# Patient Record
Sex: Male | Born: 1960 | Race: Black or African American | Hispanic: No | Marital: Single | Smoking: Former smoker
Health system: Southern US, Community
[De-identification: ages and names within clinical notes are randomized; demographics above are authoritative.]

## PROBLEM LIST (undated history)

## (undated) DIAGNOSIS — E119 Type 2 diabetes mellitus without complications: Secondary | ICD-10-CM

## (undated) DIAGNOSIS — F319 Bipolar disorder, unspecified: Secondary | ICD-10-CM

## (undated) DIAGNOSIS — J45909 Unspecified asthma, uncomplicated: Secondary | ICD-10-CM

---

## 2021-07-14 ENCOUNTER — Emergency Department (HOSPITAL_COMMUNITY): Payer: 59

## 2021-07-14 ENCOUNTER — Encounter (HOSPITAL_COMMUNITY): Payer: Self-pay | Admitting: Emergency Medicine

## 2021-07-14 ENCOUNTER — Emergency Department (HOSPITAL_COMMUNITY)
Admission: EM | Admit: 2021-07-14 | Discharge: 2021-07-15 | Disposition: A | Discharge status: Home / Self Care | Payer: 59 | Attending: Emergency Medicine | Admitting: Emergency Medicine

## 2021-07-14 ENCOUNTER — Other Ambulatory Visit: Payer: Self-pay

## 2021-07-14 DIAGNOSIS — J45909 Unspecified asthma, uncomplicated: Secondary | ICD-10-CM | POA: Insufficient documentation

## 2021-07-14 DIAGNOSIS — E119 Type 2 diabetes mellitus without complications: Secondary | ICD-10-CM | POA: Diagnosis not present

## 2021-07-14 DIAGNOSIS — W01198A Fall on same level from slipping, tripping and stumbling with subsequent striking against other object, initial encounter: Secondary | ICD-10-CM | POA: Diagnosis not present

## 2021-07-14 DIAGNOSIS — Z7901 Long term (current) use of anticoagulants: Secondary | ICD-10-CM | POA: Insufficient documentation

## 2021-07-14 DIAGNOSIS — Z96652 Presence of left artificial knee joint: Secondary | ICD-10-CM | POA: Insufficient documentation

## 2021-07-14 DIAGNOSIS — R9082 White matter disease, unspecified: Secondary | ICD-10-CM | POA: Diagnosis not present

## 2021-07-14 DIAGNOSIS — S40011A Contusion of right shoulder, initial encounter: Secondary | ICD-10-CM | POA: Insufficient documentation

## 2021-07-14 DIAGNOSIS — S4991XA Unspecified injury of right shoulder and upper arm, initial encounter: Secondary | ICD-10-CM | POA: Diagnosis present

## 2021-07-14 DIAGNOSIS — Z87891 Personal history of nicotine dependence: Secondary | ICD-10-CM | POA: Diagnosis not present

## 2021-07-14 DIAGNOSIS — Z79899 Other long term (current) drug therapy: Secondary | ICD-10-CM | POA: Insufficient documentation

## 2021-07-14 DIAGNOSIS — W19XXXA Unspecified fall, initial encounter: Secondary | ICD-10-CM

## 2021-07-14 HISTORY — DX: Type 2 diabetes mellitus without complications: E11.9

## 2021-07-14 HISTORY — DX: Bipolar disorder, unspecified: F31.9

## 2021-07-14 HISTORY — DX: Unspecified asthma, uncomplicated: J45.909

## 2021-07-14 NOTE — ED Notes (Signed)
PTAR called  

## 2021-07-14 NOTE — ED Triage Notes (Signed)
Pt BIB [facility] for a mechanical fall on Plavix.  Pt was entering house, a slpper came off and his sock got wet and he slipped and fell entering home. States he hit his head on the door, his left shoulder on a counter, and his bottom hurts from hitting the floor.   EMS reports VS WNL, CBG 346.  PT uses walker and cane at home.  Dan Humphreys is here with pt.

## 2021-07-14 NOTE — Progress Notes (Signed)
°   07/14/21 2215  TOC ED Mini Assessment  TOC Time spent with patient (minutes): 30  PING Used in TOC Assessment Yes  Admission or Readmission Diverted Yes  Interventions which prevented an admission or readmission [facility] or Services  What brought you to the Emergency Department?  Fall on Plavix  Barriers to Discharge No Barriers Identified  Barrier interventions CSW met with Pt at bedside. Pt reports that he has been living with daughter in [place] until he and daughter had a disagreement. Pt returned home yesterday.  Today he drove his car to social services to apply for disability and upon his return home his shoe fell off and he lost his footing and fell.  Pt initially requested to be sent to a long-term nursing facility. CSW explained that would be an out-of-pocket expense. CSW also explained that Pt has recently discharged form a rehab facility and would need 60 well days before returning to SNF.   Pt states that he is able to get around at home with walker and cane. Pt further reports that he is able to cook for himself and he has food to cook. Pt reports that he will speak with daughter in [place] to discuss possibility of returning to her home. Pt also states that he has another daughter in [place] that will be able to offer assistance after the 07/26/21.  Means of departure Ambulance

## 2021-07-14 NOTE — ED Provider Notes (Signed)
[facility] EMERGENCY DEPARTMENT Provider Note   CSN: [id number] Arrival date & time: 07/14/21  1853     History Chief Complaint  Patient presents with   Fall    Thinners     Norbert Malkin is a 60 y.o. male.  Pt reports his hoe slipped while going into his house and he fell.  Pt complains of pain in his bottom and both shoulders.  Pt reports hitting his head on the door.  Pt reports just a small bumping.  No loc.  Pt reports pain with moving right shoulder   The history is provided by the patient. No language interpreter was used.  Fall This is a new problem. The current episode started less than 1 hour ago. The problem occurs constantly. The problem has not changed since onset.Nothing aggravates the symptoms. Nothing relieves the symptoms. He has tried nothing for the symptoms.      Past Medical History:  Diagnosis Date   Asthma    Bipolar 1 disorder (HCC)    Diabetes mellitus without complication (HCC)     There are no problems to display for this patient.   Past Surgical History:  Procedure Laterality Date   KNEE ARTHROSCOPY     ROTATOR CUFF REPAIR Left        Family History  Problem Relation Age of Onset   Cancer Mother    ALS Father     Social History   Tobacco Use   Smoking status: Former    Types: Cigarettes   Smokeless tobacco: Never  Vaping Use   Vaping Use: Never used  Substance Use Topics   Alcohol use: Not Currently   Drug use: Not Currently    Home Medications Prior to Admission medications   Medication Sig Start Date End Date Taking? Authorizing Provider  amitriptyline (ELAVIL) 50 MG tablet Take 50 mg by mouth at bedtime. 05/09/21   [provider]  atorvastatin (LIPITOR) 80 MG tablet Take 80 mg by mouth at bedtime. 07/02/21   [provider]  clopidogrel (PLAVIX) 75 MG tablet Take 75 mg by mouth daily. 07/02/21   [provider]  cyclobenzaprine (FLEXERIL) 10 MG tablet Take 10 mg by mouth 2  (two) times daily as needed. 02/17/21   [provider]  diclofenac (VOLTAREN) 50 MG EC tablet Take 50 mg by mouth 2 (two) times daily. 05/20/21   [provider]  lisinopril (ZESTRIL) 20 MG tablet Take 20 mg by mouth daily. 07/02/21   [provider]  meloxicam (MOBIC) 15 MG tablet Take 15 mg by mouth daily as needed. 04/29/21   [provider]  NOVOLIN 70/30 KWIKPEN (70-30) 100 UNIT/ML KwikPen SMARTSIG:20 Unit(s) SUB-Q Every Morning 07/02/21   [provider]  ondansetron (ZOFRAN-ODT) 4 MG disintegrating tablet Take 4 mg by mouth every 8 (eight) hours as needed. 04/14/21   [provider]  orphenadrine (NORFLEX) 100 MG tablet Take 100 mg by mouth 2 (two) times daily as needed. 04/07/21   [provider]  scopolamine (TRANSDERM-SCOP) 1 MG/3DAYS SMARTSIG:Topical 07/02/21   [provider]  sildenafil (VIAGRA) 100 MG tablet Take 100 mg by mouth as needed. 05/06/21   [provider]  tamsulosin (FLOMAX) 0.4 MG CAPS capsule Take 0.4 mg by mouth daily. 07/02/21   [provider]  traMADol (ULTRAM) 50 MG tablet Take 50-100 mg by mouth every 6 (six) hours as needed. 02/17/21   [provider]    Allergies    Percocet [oxycodone-acetaminophen]  and Sulfa antibiotics  Review of Systems   Review of Systems  All other systems reviewed and are negative.  Physical Exam Updated Vital Signs BP 128/88 (BP Location: Right Arm)    Pulse (!) 102    Temp 98.9 F (37.2 C) (Oral)    Resp 20    SpO2 98%   Physical Exam Vitals and nursing note reviewed.  Constitutional:      Appearance: He is well-developed.  HENT:     Head: Normocephalic and atraumatic.     Right Ear: External ear normal.     Left Ear: External ear normal.     Mouth/Throat:     Mouth: Mucous membranes are moist.  Eyes:     Pupils: Pupils are equal, round, and reactive to light.  Cardiovascular:     Rate and Rhythm: Normal rate.  Pulmonary:      Effort: Pulmonary effort is normal.  Abdominal:     General: There is no distension.  Musculoskeletal:        General: Tenderness present.     Cervical back: Normal range of motion.     Comments: Tender bilat shoulders  no deformity,  pain with lifting right shoulder,  left side paralysis,  pain in left shoulder to palpation   Skin:    General: Skin is warm.  Neurological:     Mental Status: He is alert and oriented to person, place, and time.  Psychiatric:        Mood and Affect: Mood normal.    ED Results / Procedures / Treatments   Labs (all labs ordered are listed, but only abnormal results are displayed) Labs Reviewed - No data to display  EKG None  Radiology DG Pelvis 1-2 Views  Result Date: 07/14/2021 CLINICAL DATA:  Fall. EXAM: PELVIS - 1-2 VIEW COMPARISON:  None. FINDINGS: There is no evidence of pelvic fracture or diastasis. No pelvic bone lesions are seen. IMPRESSION: No acute fracture. Electronically Signed   By: Thornell Sartorius M.D.   On: 07/14/2021 20:36   DG Shoulder Right  Result Date: 07/14/2021 CLINICAL DATA:  Fall. EXAM: RIGHT SHOULDER - 2+ VIEW COMPARISON:  None. FINDINGS: There is no evidence of fracture or dislocation. Mild degenerative changes are present at the acromioclavicular and glenohumeral joints. Cervical spinal fusion hardware is noted. Soft tissues are unremarkable. IMPRESSION: No acute fracture or dislocation. Electronically Signed   By: Thornell Sartorius M.D.   On: 07/14/2021 20:34   CT Head Wo Contrast  Result Date: 07/14/2021 CLINICAL DATA:  Fall, Plavix. EXAM: CT HEAD WITHOUT CONTRAST TECHNIQUE: Contiguous axial images were obtained from the base of the skull through the vertex without intravenous contrast. COMPARISON:  Head CT 06/01/2021 FINDINGS: Brain: No acute intracranial hemorrhage. No focal mass lesion. No CT evidence of acute infarction. No midline shift or mass effect. No hydrocephalus. Basilar cisterns are patent. There are  periventricular and subcortical white matter hypodensities. Generalized cortical atrophy. Vascular: No hyperdense vessel or unexpected calcification. Skull: Normal. Negative for fracture or focal lesion. Sinuses/Orbits: Paranasal sinuses and mastoid air cells are clear. Orbits are clear. Other: None. IMPRESSION: No acute intracranial findings. Electronically Signed   By: Genevive Bi M.D.   On: 07/14/2021 20:22   DG Shoulder Left  Result Date: 07/14/2021 CLINICAL DATA:  Fall. EXAM: LEFT SHOULDER - 2+ VIEW COMPARISON:  None. FINDINGS: There is no evidence of fracture or dislocation. Degenerative changes are present at the acromioclavicular and glenohumeral joints. Spinal fusion hardware is noted. Soft tissues  are unremarkable. IMPRESSION: No acute fracture or dislocation. Electronically Signed   By: Thornell Sartorius M.D.   On: 07/14/2021 20:35    Procedures Procedures   Medications Ordered in ED Medications - No data to display  ED Course  I have reviewed the triage vital signs and the nursing notes.  Pertinent labs & imaging results that were available during my care of the patient were reviewed by me and considered in my medical decision making (see chart for details).    MDM Rules/Calculators/A&P                         MDM:  [facility] xrays  no fracture.  Ct head no acute abnormality   Pt request to go to a facility. Social work spoke with pt.      Final Clinical Impression(s) / ED Diagnoses Final diagnoses:  Fall, initial encounter  Contusion of multiple sites of right shoulder, initial encounter    Rx / DC Orders ED Discharge Orders     None        Osie Cheeks 07/14/21 2157    Tegeler, Canary Brim, MD 07/14/21 [phone]

## 2021-07-14 NOTE — Discharge Instructions (Addendum)
Return if any problems.

## 2021-09-09 ENCOUNTER — Encounter: Payer: Self-pay | Admitting: Physical Therapy

## 2021-09-09 ENCOUNTER — Other Ambulatory Visit: Payer: Self-pay

## 2021-09-09 ENCOUNTER — Ambulatory Visit: Payer: Medicaid Other | Attending: Family Medicine | Admitting: Physical Therapy

## 2021-09-09 DIAGNOSIS — R2681 Unsteadiness on feet: Secondary | ICD-10-CM | POA: Insufficient documentation

## 2021-09-09 DIAGNOSIS — R262 Difficulty in walking, not elsewhere classified: Secondary | ICD-10-CM | POA: Diagnosis present

## 2021-09-09 DIAGNOSIS — M6281 Muscle weakness (generalized): Secondary | ICD-10-CM | POA: Diagnosis present

## 2021-09-09 NOTE — Therapy (Signed)
[facility] [address]  [address] [place], [place], [address] Phone: [phone]   Fax:  [phone]  Physical Therapy Evaluation  Patient Details  Name: Aaron Mueller Aaron Mueller MRN: [id number] Date of Birth: 11/23/1960 Referring Provider (PT): Gevena MartNzemwa, Ruth   Encounter Date: 09/09/2021   PT End of Session - 09/09/21 1158     Visit Number 1    Number of Visits 1    Authorization Type Medicaid    PT Start Time 0930    PT Stop Time 1015    PT Time Calculation (min) 45 min    Activity Tolerance Patient tolerated treatment well    Behavior During Therapy Agitated             Past Medical History:  Diagnosis Date   Asthma    Bipolar 1 disorder (HCC)    Diabetes mellitus without complication (HCC)     Past Surgical History:  Procedure Laterality Date   KNEE ARTHROSCOPY     ROTATOR CUFF REPAIR Left     There were no vitals filed for this visit.    Subjective Assessment - 09/09/21 0900     Subjective Pt. arrives to PT very tearful and upset that he is "falling apart", reporting no money and no gas money, had to drive across town to attend PT here, arrived at 6:30 AM for 8:45 AM appointment because of confusion where to go, even though he map quested and saw it was 16 min drive.  He said he had brain aneurism and was a workman's comp case, but then lawyer not returning calls.  York SpanielSaid he tried to get Medicaid transportation but they hung up on him.  He attended SNF [facility].  Could not find record of OPPT but [place]  Reports no movement in LUE, weakness in LLE, but has sensation in both.  He would like to work on his arm as well as strengthen leg.    Pertinent History From medical record on 06/01/2021: Aaron Mueller is a 61 year old man with medical history significant for type 2 diabetes mellitus, asthma, fall in August 2022, who presented to the hospital with left-sided weakness involving  the left upper and lower extremities. He had been having right-sided headache and left arm weakness since his fall in August 2022. About 2 days prior to admission, he noticed worsening left-sided weakness, slurred speech and facial droop. He was found to have acute stroke involving the right MCA. He was evaluated in the ED but he was deemed not to be a candidate for tPA because it was not exactly clear how long his symptoms had been present. Neurologist was consulted and dual antiplatelet therapy with aspirin 325 mg daily once Plavix and 5 mg daily for 3 months followed by aspirin 325 mg daily monotherapy was recommended. NSAIDs should be avoided because of treatment with dual antiplatelet therapy. He also developed urinary retention and Foley catheter was placed. He was started on Flomax. Voiding trial can be done in the outpatient setting so the Foley catheter can be removed as able. He was evaluated by PT and OT who recommended further rehabilitation at the inpatient rehab center. His condition has improved and he is deemed stable for discharge today.    Limitations Lifting;Standing;Walking;House hold activities    Patient Stated Goals "work on my arm and leg"    Currently in Pain? Yes    Pain Score 5  Pain Location Shoulder    Pain Orientation Right    Pain Descriptors / Indicators Dull    Pain Type Chronic pain    Pain Radiating Towards from shoulder to hand    Pain Onset Other (comment)   since stroke 05/2021   Pain Frequency Constant                [facility] PT Assessment - 09/09/21 0001       Assessment   Medical Diagnosis Hemiparesis, CVA    Referring Provider (PT) Gevena Mart    Onset Date/Surgical Date 06/01/21    Hand Dominance Right    Prior Therapy SNF, HHPT?      Precautions   Precautions Fall      Restrictions   Weight Bearing Restrictions No      Balance Screen   Has the patient fallen in the past 6 months Yes    How many times? 13   fell 2x recently in yard    Has the patient had a decrease in activity level because of a fear of falling?  Yes    Is the patient reluctant to leave their home because of a fear of falling?  Yes      Home Environment   Living Environment Private residence    Living Arrangements Alone    Type of Home Apartment    Home Access Stairs to enter    Entrance Stairs-Number of Steps 3    Entrance Stairs-Rails None    Home Layout One level    Home Equipment Cane - quad;Wheelchair - manual   hemi walker     Prior Function   Level of Independence Independent    Vocation Workers comp      Cognition   Overall Cognitive Status No family/caregiver present to determine baseline cognitive functioning    Behaviors Perseveration;Lability;Verbal agitation      Observation/Other Assessments   Observations very tearful and upset, perseverating on financial difficulties and other difficulties, difficult to redirect to therapy concerns.  Did apologize at end of session to receptionist.      Sensation   Light Touch Appears Intact    Additional Comments intact in L hand, reports sensation throughout LE but not tested      Coordination   Gross Motor Movements are Fluid and Coordinated No   noted difficulty with coordination LLE     ROM / Strength   AROM / PROM / Strength Strength      Strength   Overall Strength Deficits    Strength Assessment Site Hip;Knee;Ankle    Right/Left Hip Left    Left Hip Flexion 4+/5    Left Hip ABduction 4+/5    Left Hip ADduction 4+/5    Right/Left Knee Left    Left Knee Flexion 3+/5    Left Knee Extension 3+/5    Right/Left Ankle Left    Left Ankle Dorsiflexion 2/5    Left Ankle Plantar Flexion 2/5    Left Ankle Inversion 1/5    Left Ankle Eversion 1/5      Ambulation/Gait   Ambulation/Gait Yes    Ambulation/Gait Assistance 6: Modified independent (Device/Increase time)    Ambulation Distance (Feet) 50 Feet    Assistive device Small based quad cane    Gait Pattern Step-to pattern;Left  foot flat;Lateral trunk lean to right    Gait velocity 0.2 m/s      Balance   Balance Assessed Yes      Static Standing Balance  Static Standing - Balance Support No upper extremity supported    Static Standing - Level of Assistance 5: Stand by assistance    Static Standing Balance -  Activities  Romberg - Eyes Opened;Romberg - Eyes Closed;Sharpened Romberg - Eyes Open    Static Standing - Comment/# of Minutes able to stand 30 seconds with eyes open and closed, unable to get into semi-tandem without UE support and unable to maintain position.      Standardized Balance Assessment   Standardized Balance Assessment Timed Up and Go Test;Five Times Sit to Stand    Five times sit to stand comments  26 seconds with 1 UE support      Timed Up and Go Test   TUG Normal TUG    Normal TUG (seconds) 45    TUG Comments with SBCQ and SBA                        Objective measurements completed on examination: See above findings.                PT Education - 09/09/21 1157     Education Details reviewed current HEP, discussed distance to clinics and recommended services.    Person(s) Educated Patient    Methods Explanation;Demonstration    Comprehension Verbalized understanding              PT Short Term Goals - 09/09/21 1218       PT SHORT TERM GOAL #1   Title Pt. will demonstrate HEP    Baseline Pt. has current HEP that is appropriate and can perform independently.    Time 1    Period Days    Status Achieved    Target Date 09/09/21                       Plan - 09/09/21 1158     Clinical Impression Statement Mr. 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  He was very labile, tearful at beginning of session, was very angry at check in, and expressed frustration throughout session due to financial limitations.  He tended to perseverate throughout session on workman's comp and lawyers and needed a lot  of redirection to answer therapy questions.  He also arrived over 2 hours early due to concerns about finding location, suggesting some cognitive impairments.  He demonstrates hemiplegic gait, weakness throughout LLE especially L ankle, flaccid LUE, increased tone in L ankle, and impaired balance and coordination with history of falls.  His 5x STS score of 26 seconds and TUG of 45 seconds both indicate high risk of falls.  Due to need for other disciplines like OT and possibly ST as well, and confusion and financial stress involved with driving to this location, discussed contacting referring physician for referral to outpatient rehab at [facility] center on [facility] in [place], which is very close to his home and located next to his doctor's office and offers all 3 disciplines.  Also offered [facility] location in [place], but patient felt this location would be to far (about equal distance to this location or the [facility] location) and would prefer to go to [facility] location, so this will be one time visit.  Reviewed his current exercises, he will continue to perform.    Personal Factors and Comorbidities Behavior Pattern;Past/Current Experience;Time since onset of injury/illness/exacerbation;Social Background;Finances;Comorbidity 3+    Comorbidities T2DM,  CVA,  mood disorder (bipolar)    Examination-Activity Limitations Carry;Dressing;Lift;Locomotion Level;Reach Overhead;Transfers;Stand;Stairs;Squat    Examination-Participation Restrictions Cleaning;Yard Work;Community Activity;Laundry;Driving    Stability/Clinical Decision Making Evolving/Moderate complexity    Clinical Decision Making Moderate    Rehab Potential Fair    PT Frequency One time visit    PT Treatment/Interventions ADLs/Self Care Home Management;Therapeutic activities;Therapeutic exercise;Balance training;Neuromuscular re-education;Functional mobility training;Stair training;Gait training;Patient/family education    Recommended  Other Services OT, ST    Consulted and Agree with Plan of Care Patient             Patient will benefit from skilled therapeutic intervention in order to improve the following deficits and impairments:  Abnormal gait, Decreased activity tolerance, Decreased strength, Impaired UE functional use, Pain, Decreased balance, Decreased coordination, Decreased mobility, Decreased safety awareness, Difficulty walking, Impaired tone  Visit Diagnosis: Unsteadiness on feet  Difficulty in walking, not elsewhere classified  Muscle weakness (generalized)     Problem List There are no problems to display for this patient.   Jena Gauss, PT, DPT  09/09/2021, 12:18 PM  [facility] [address]  [address] [place], [place], [address] Phone: [phone]   Fax:  [phone]  Name: Smokey Melott MRN: [id number] Date of Birth: 09-Jun-1961

## 2021-09-16 ENCOUNTER — Ambulatory Visit: Payer: Self-pay | Admitting: Physical Therapy

## 2023-02-02 IMAGING — CT CT HEAD W/O CM
3 series · 16 of 47 positions shown, 19 images · non-contrast
Comparison: Head CT 06/01/2021

CLINICAL DATA: Fall, Plavix.

EXAM:
CT HEAD WITHOUT CONTRAST
TECHNIQUE: Contiguous axial images were obtained from the base of the skull
through the vertex without intravenous contrast.

[Series 3: head 5.0 h30s · axial · 0.45mm/px · z∈[+1004,+1154]mm · 10 of 36 slices shown, 13 images]
[im 3/36  brain]
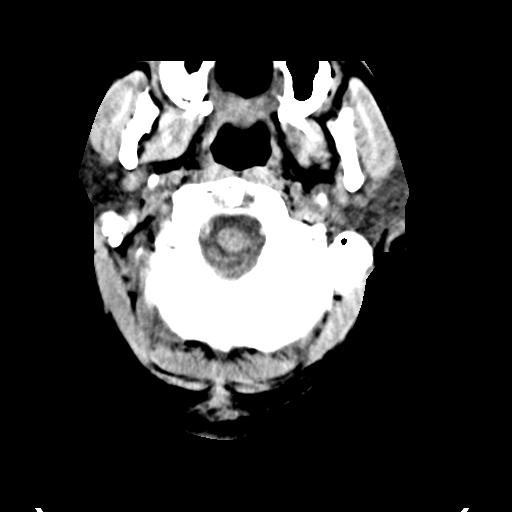
[im 3/36  bone]
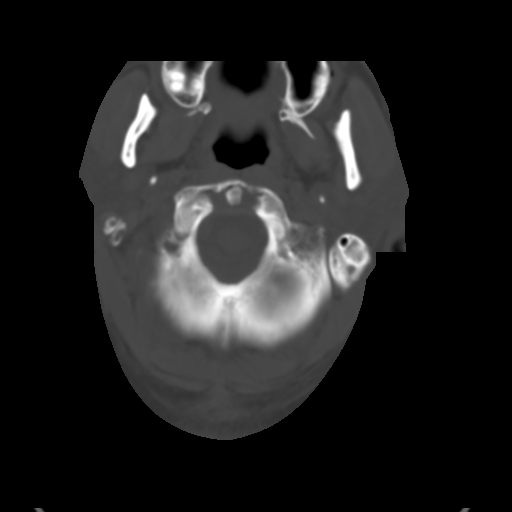
[im 7/36  brain]
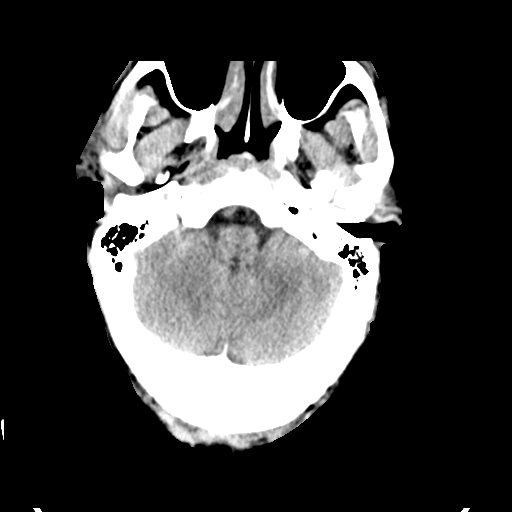
[im 10/36  brain]
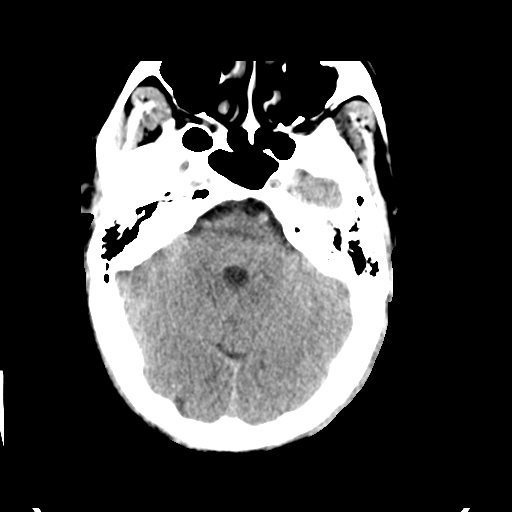
[im 13/36  brain]
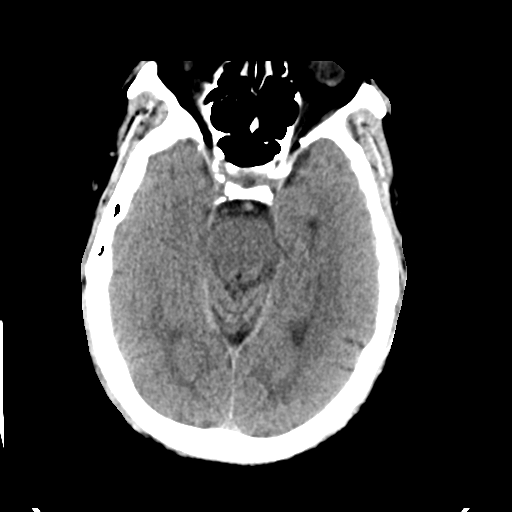
[im 16/36  brain]
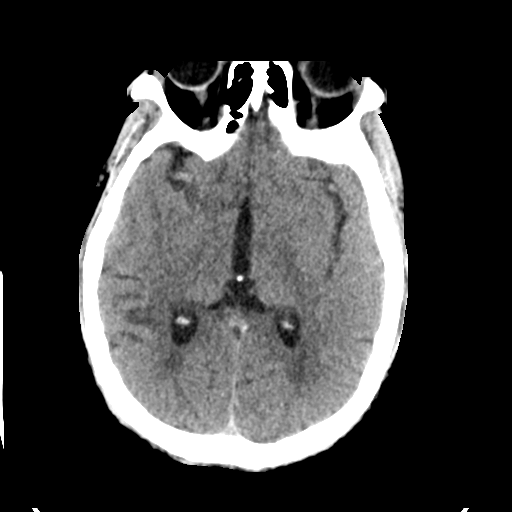
[im 16/36  bone]
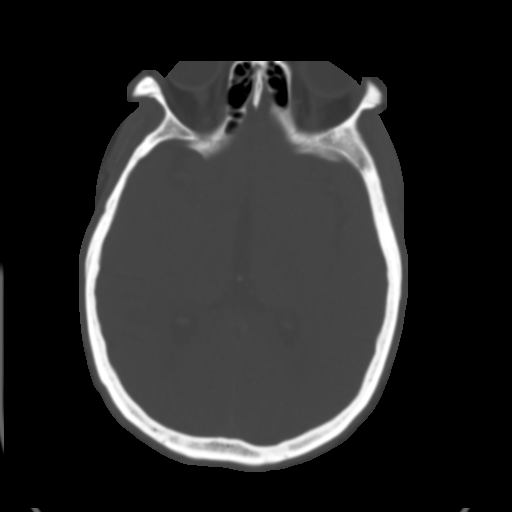
[im 20/36  brain]
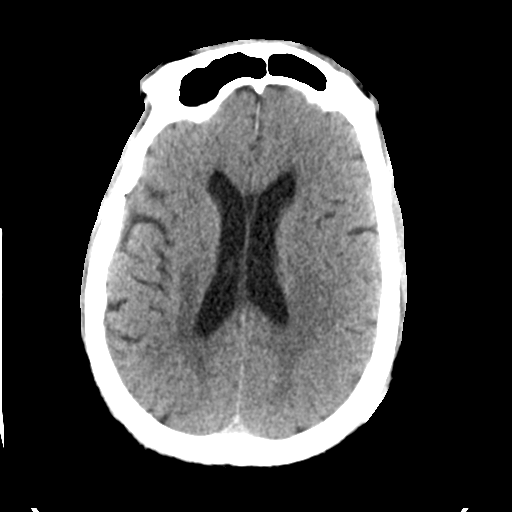
[im 23/36  brain]
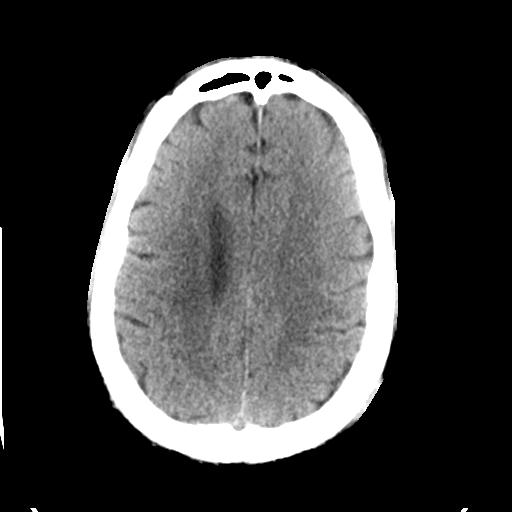
[im 27/36  brain]
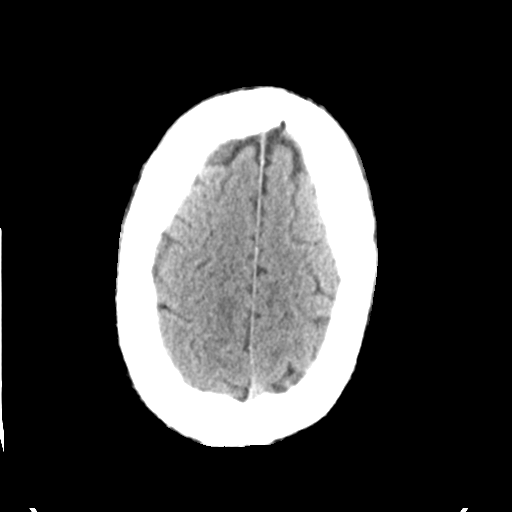
[im 29/36  brain]
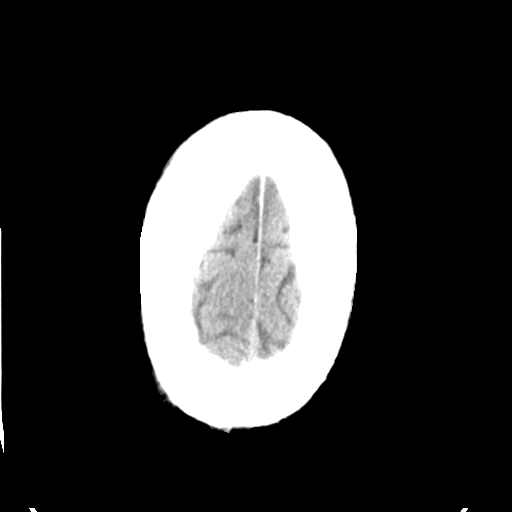
[im 29/36  bone]
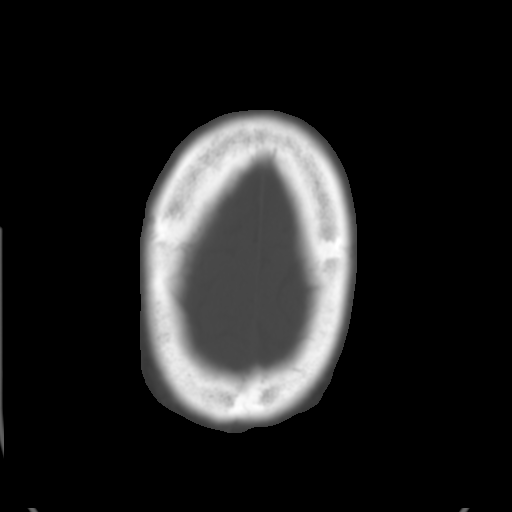
[im 33/36  brain]
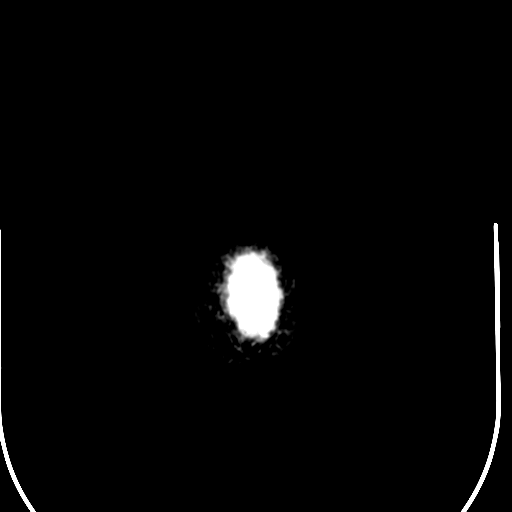

[Series 5: head 3.0 mpr cor · coronal · 0.36mm/px · 3 of 73 slices shown]
[im 25/73  brain]
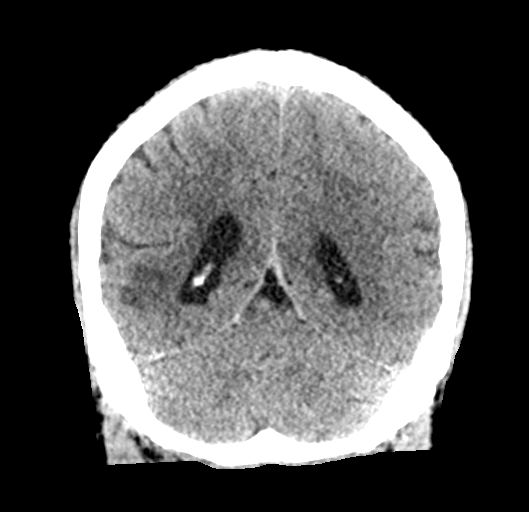
[im 33/73  brain]
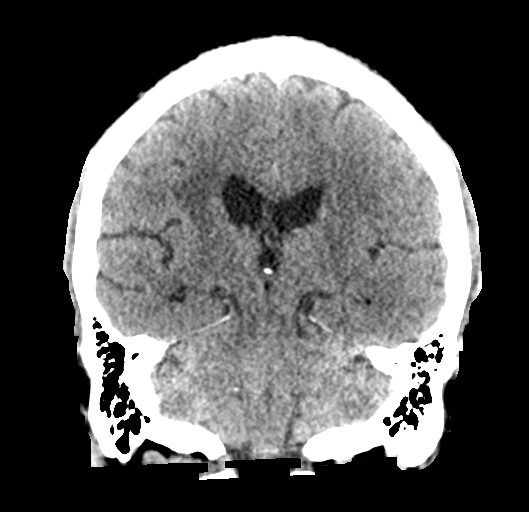
[im 41/73  brain]
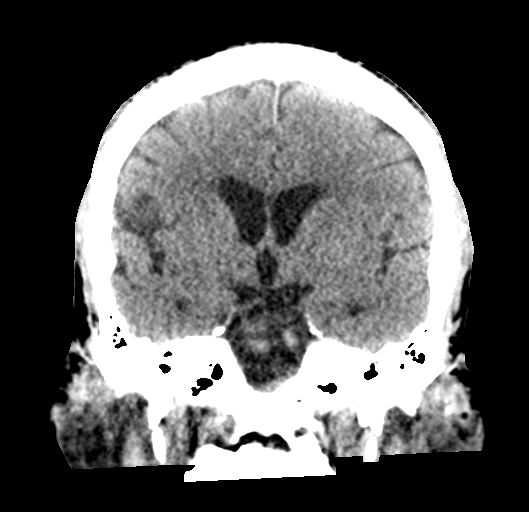

[Series 6: head 3.0 mpr sag · sagittal · 0.34mm/px · 3 of 67 slices shown]
[im 23/67  brain]
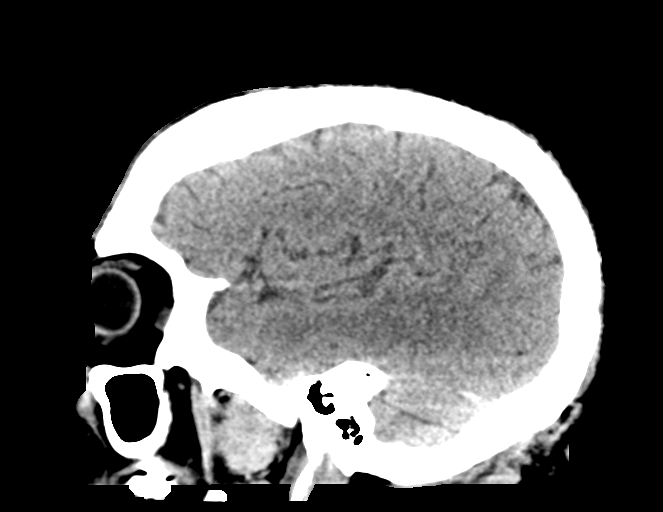
[im 34/67  brain]
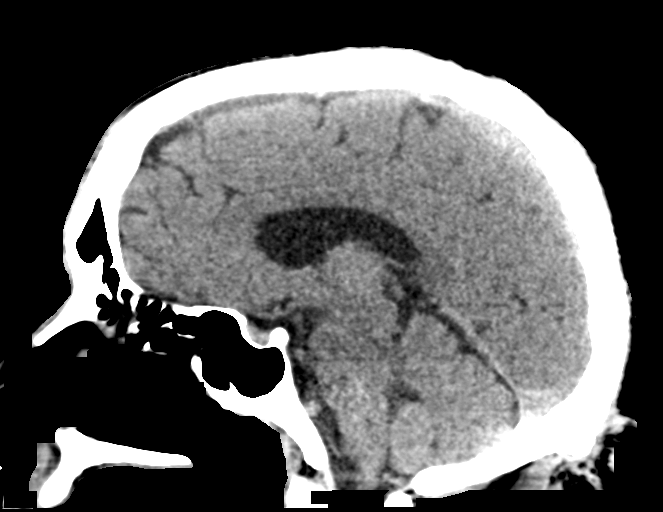
[im 45/67  brain]
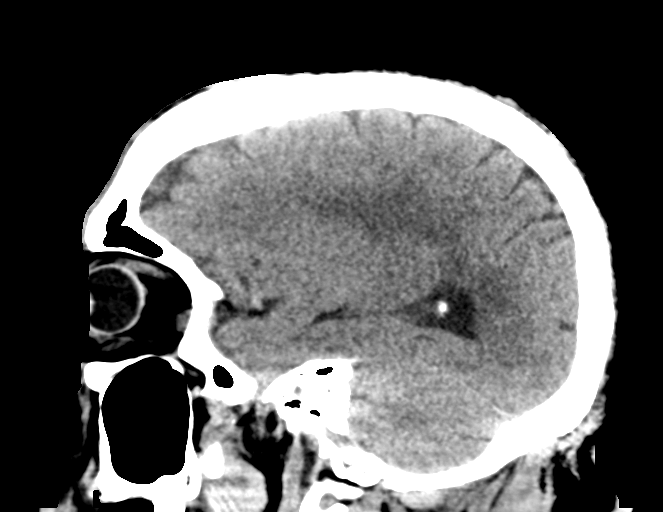

[16 of 47 positions shown; findings below may reference images not displayed]

FINDINGS: Brain: No acute intracranial hemorrhage. No focal mass lesion. No CT
evidence of acute infarction. No midline shift or mass effect. No
hydrocephalus. Basilar cisterns are patent.

There are periventricular and subcortical white matter
hypodensities. Generalized cortical atrophy.

Vascular: No hyperdense vessel or unexpected calcification.

Skull: Normal. Negative for fracture or focal lesion.

Sinuses/Orbits: Paranasal sinuses and mastoid air cells are clear.
Orbits are clear.

Other: None.
IMPRESSION: No acute intracranial findings.

## 2023-02-02 IMAGING — DX DG SHOULDER 2+V*R*
2 series · 2 of 2 positions shown · non-contrast
Comparison: None.

CLINICAL DATA: Fall.

EXAM:
RIGHT SHOULDER - 2+ VIEW

[shoulder y view (1 of 2)]
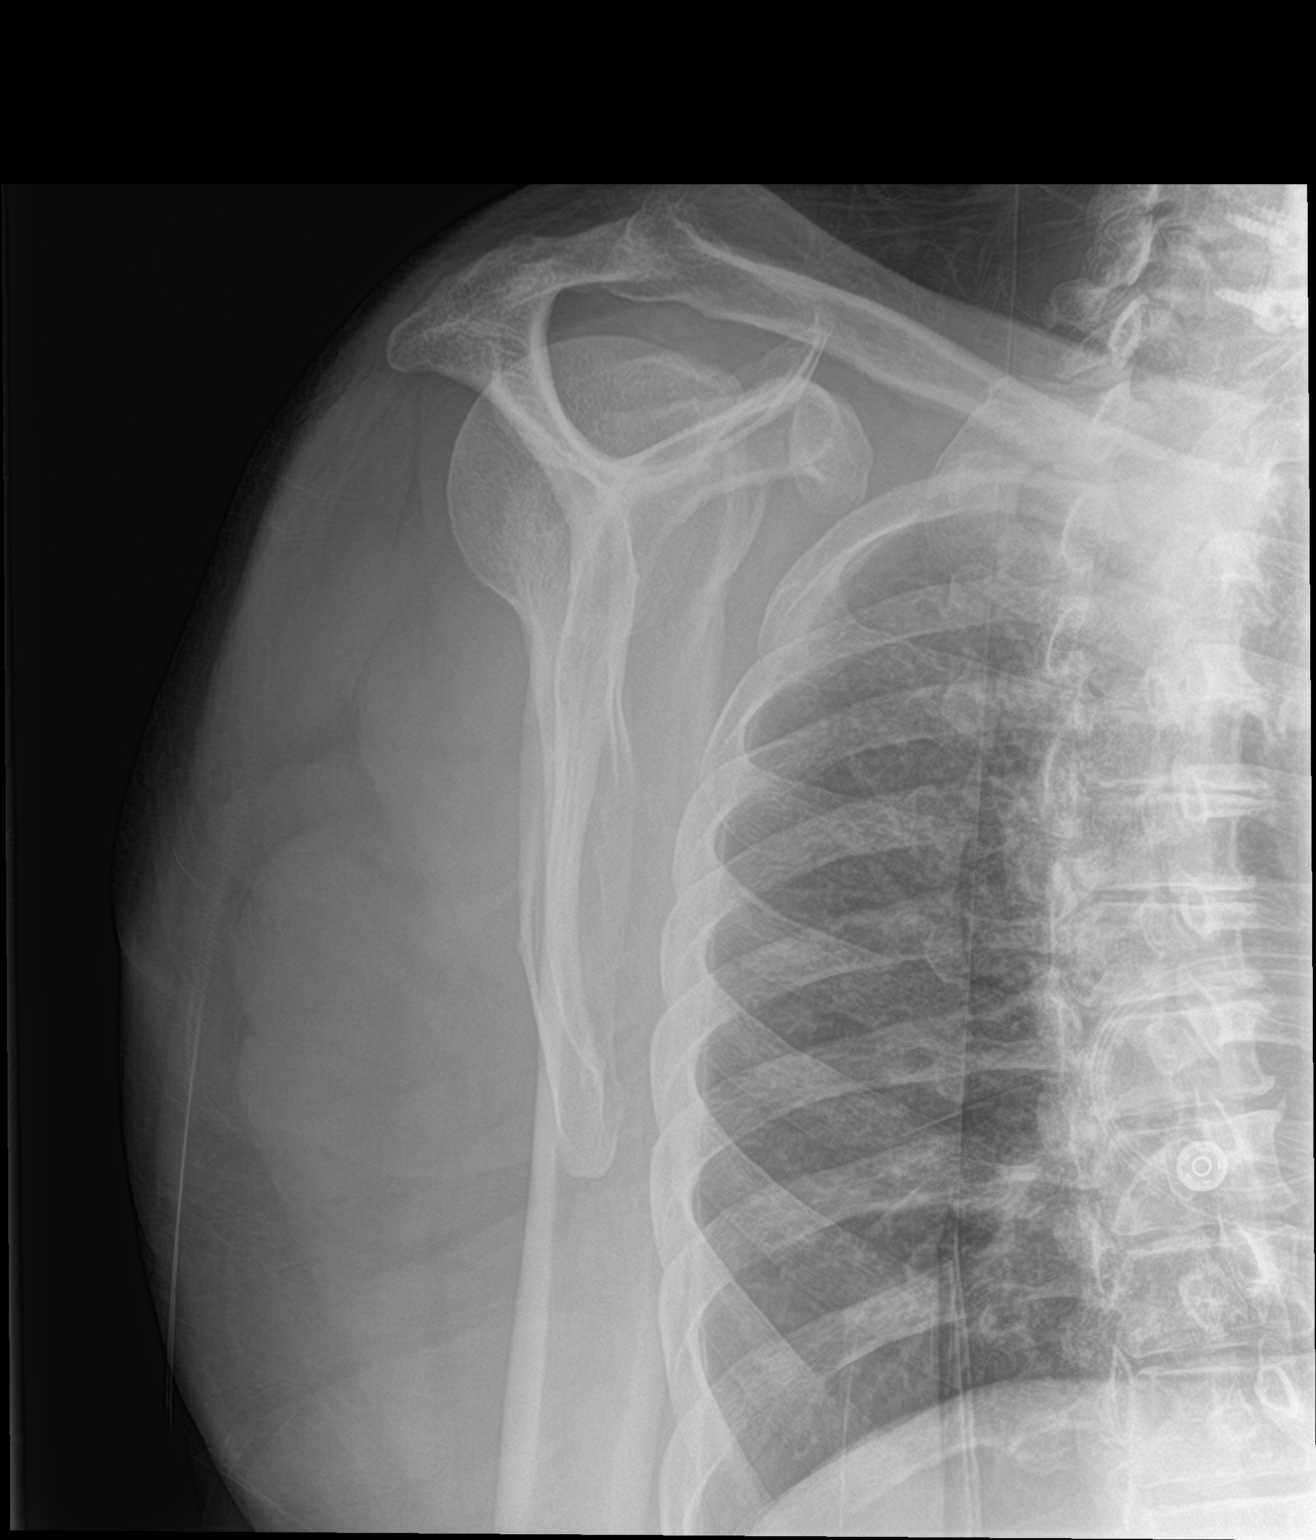

[shoulder y view (2 of 2)]
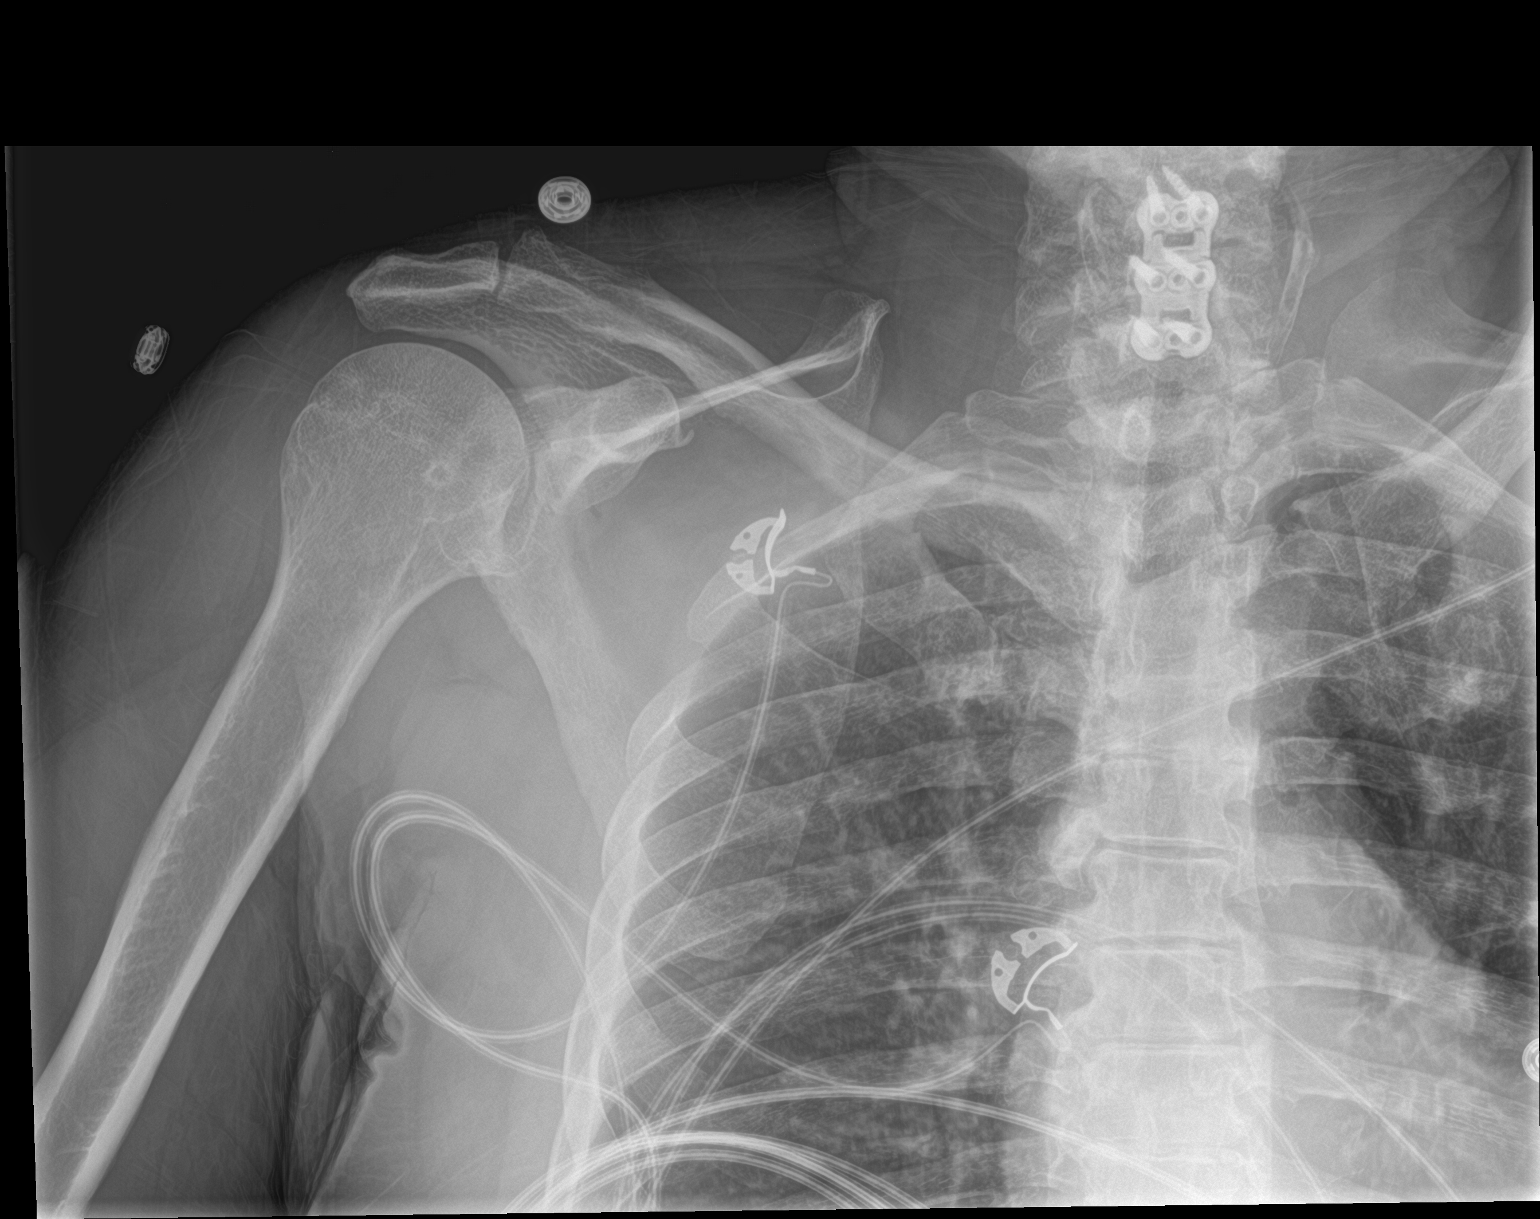

[2 of 2 positions shown; findings below may reference images not displayed]

FINDINGS: There is no evidence of fracture or dislocation. Mild degenerative
changes are present at the acromioclavicular and glenohumeral
joints. Cervical spinal fusion hardware is noted. Soft tissues are
unremarkable.
IMPRESSION: No acute fracture or dislocation.
